# Patient Record
Sex: Female | Born: 1980 | Race: White | Hispanic: No | Marital: Married | Smoking: Never smoker
Health system: Southern US, Community
[De-identification: ages and names within clinical notes are randomized; demographics above are authoritative.]

---

## 2006-01-19 ENCOUNTER — Ambulatory Visit: Payer: Self-pay | Admitting: Unknown Physician Specialty

## 2006-02-04 ENCOUNTER — Ambulatory Visit: Payer: Self-pay | Admitting: General Surgery

## 2006-03-06 ENCOUNTER — Encounter (INDEPENDENT_AMBULATORY_CARE_PROVIDER_SITE_OTHER): Payer: Self-pay | Admitting: *Deleted

## 2006-03-06 ENCOUNTER — Inpatient Hospital Stay (HOSPITAL_COMMUNITY): Admission: RE | Admit: 2006-03-06 | Discharge: 2006-03-11 | Payer: Self-pay | Admitting: Plastic Surgery

## 2006-06-08 ENCOUNTER — Emergency Department: Payer: Self-pay | Admitting: Emergency Medicine

## 2006-12-29 HISTORY — PX: AUGMENTATION MAMMAPLASTY: SUR837

## 2008-01-08 IMAGING — US US THYROID
1 series · 17 of 20 positions shown · non-contrast
Comparison: none

REASON FOR EXAM: GOITER
COMMENTS:

[Series 1: us thyroid · 17 of 20 slices shown]
[im 1/20]
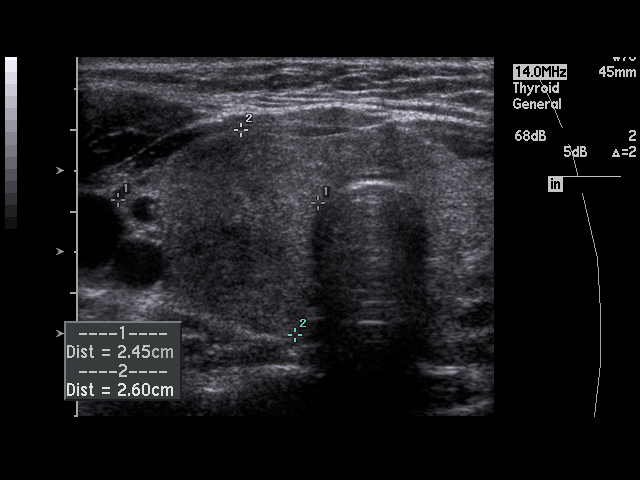
[im 2/20]
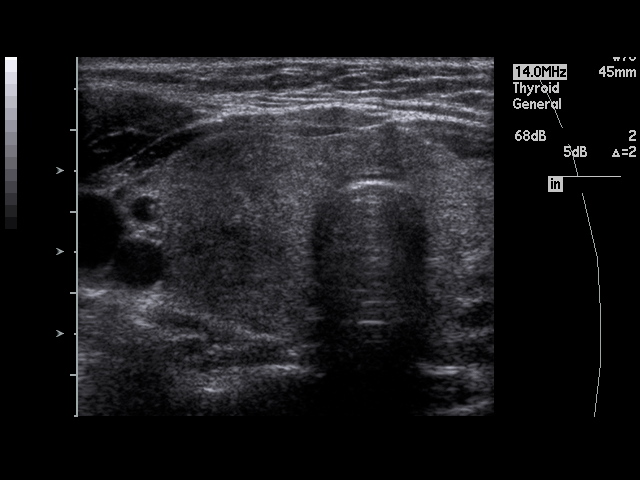
[im 3/20]
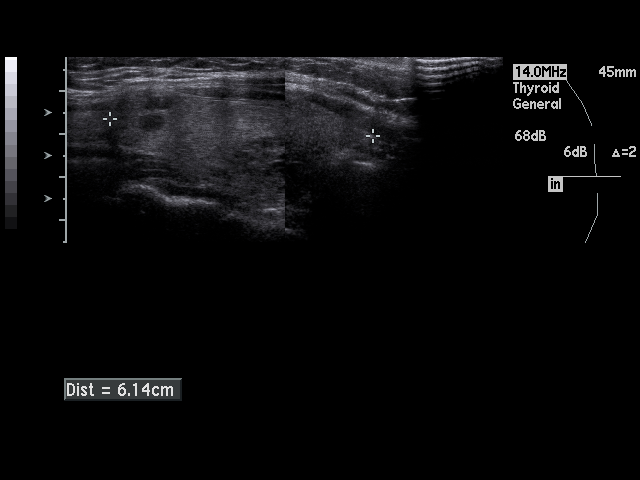
[im 5/20]
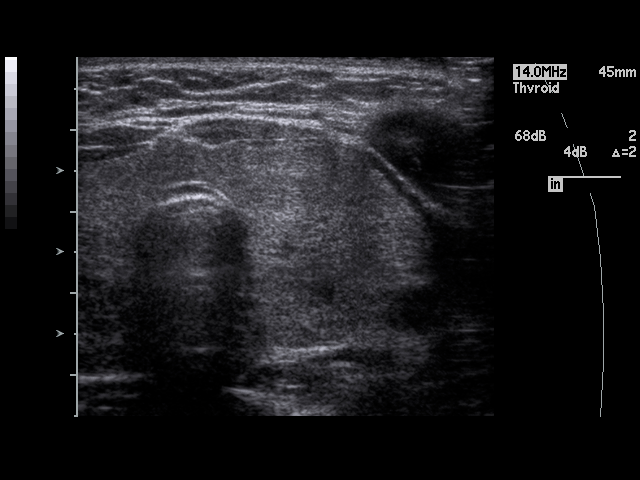
[im 6/20]
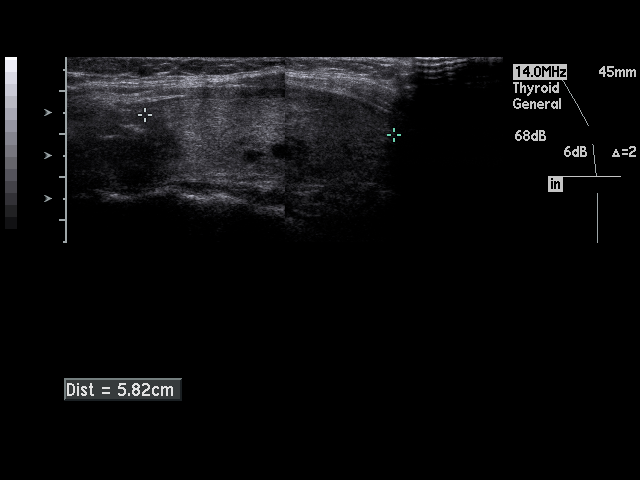
[im 7/20]
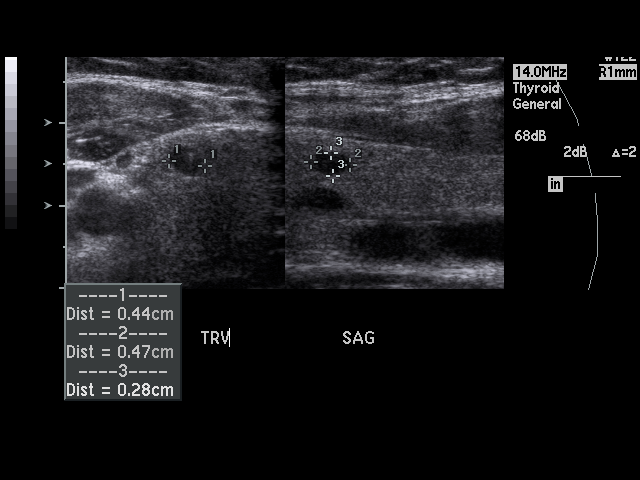
[im 8/20]
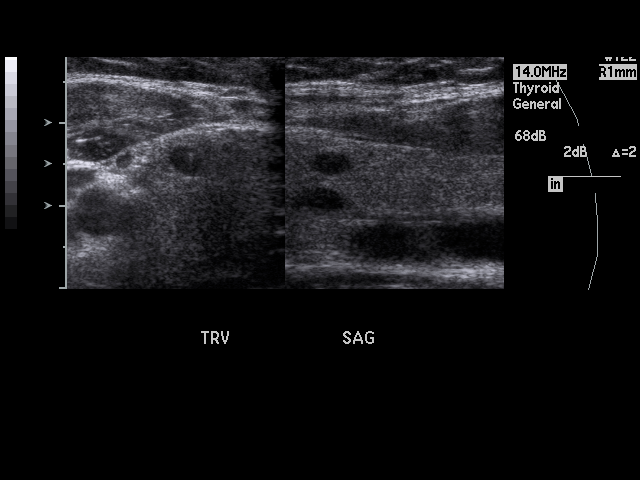
[im 9/20]
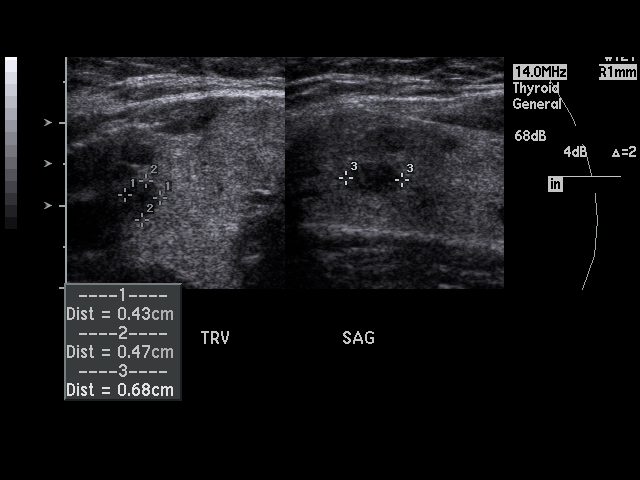
[im 11/20]
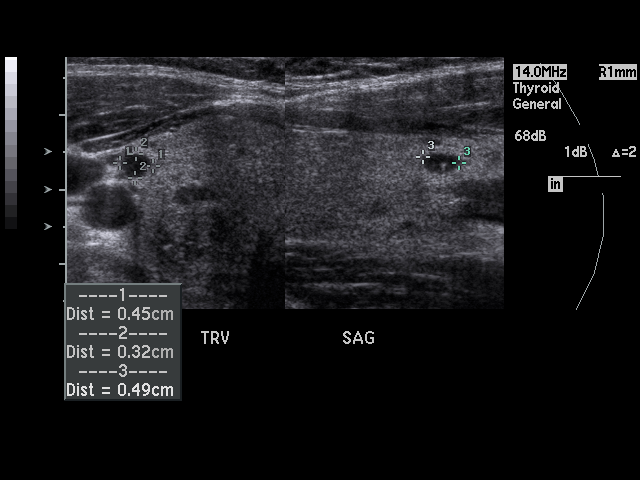
[im 12/20]
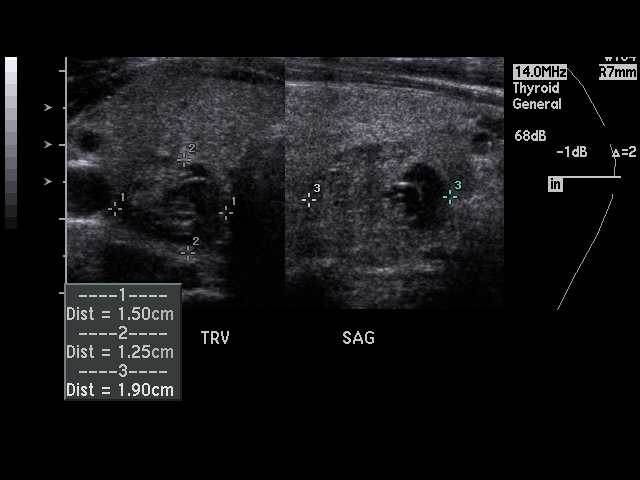
[im 13/20]
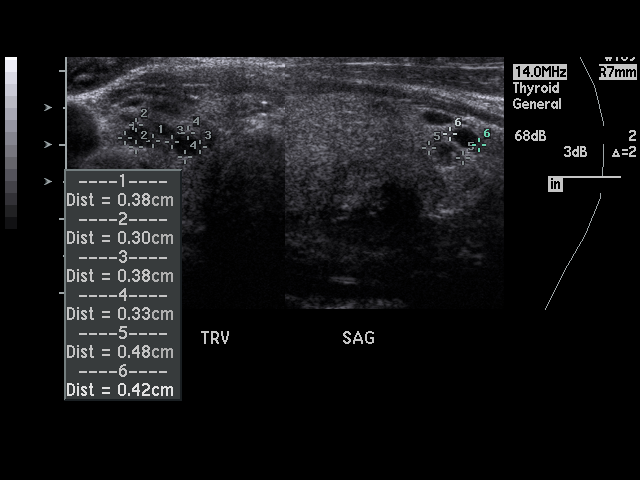
[im 14/20]
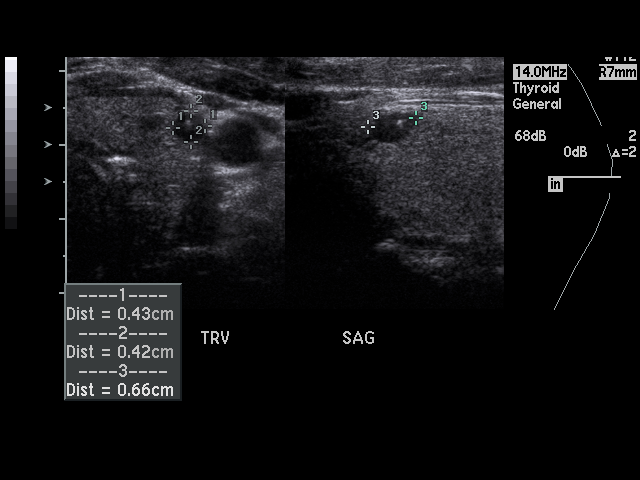
[im 15/20]
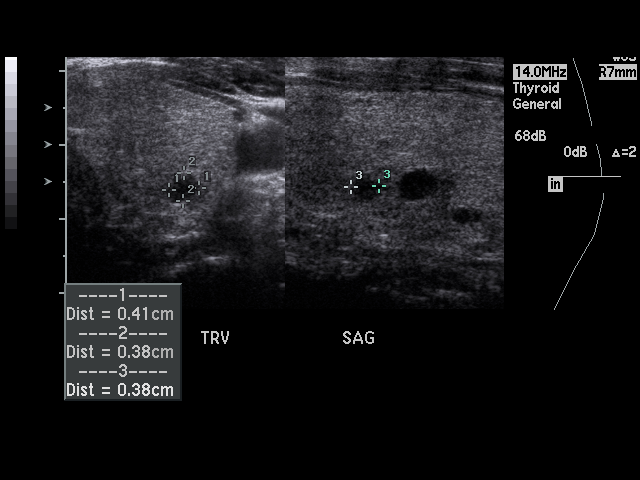
[im 16/20]
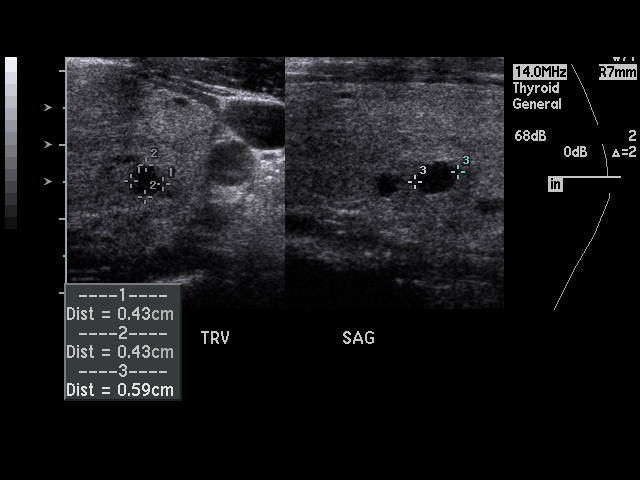
[im 18/20]
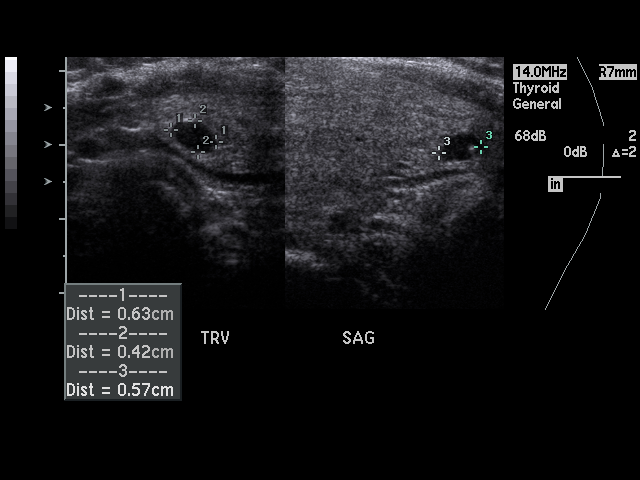
[im 19/20]
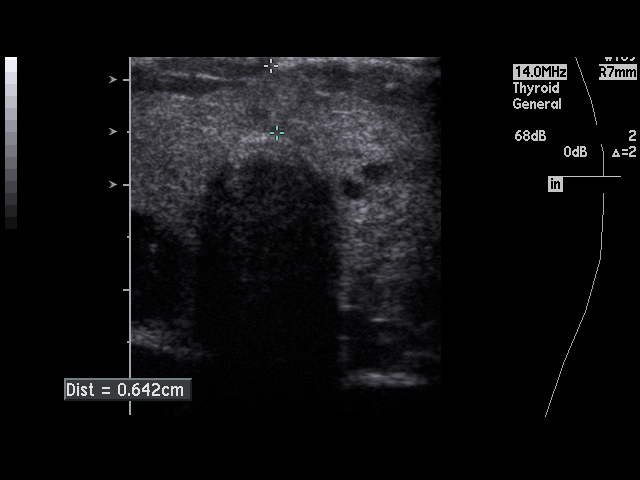
[im 20/20]
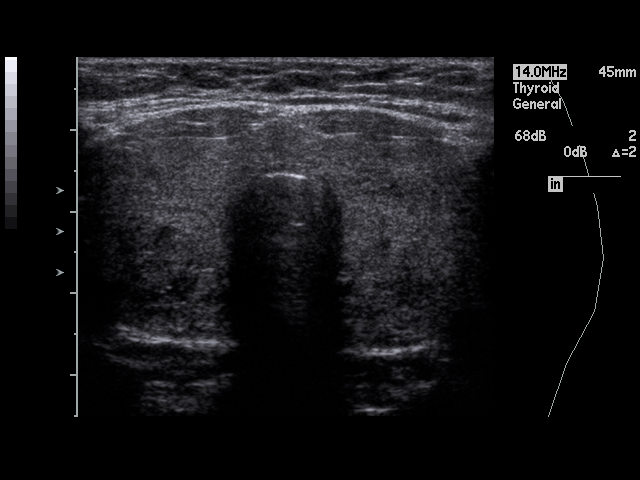

[17 of 20 positions shown; findings below may reference images not displayed]

PROCEDURE:     US  - US THYROID  - January 19, 2006  [DATE]

RESULT:     The RIGHT lobe measures 6.14 cm x 2.60 cm x 2.45 cm. The LEFT
lobe measures 5.82 cm x 2.46 cm x 2.28 cm. There are noted multiple, small,
solid, cystic and complex nodules scattered throughout both lobes. The
largest is a complex nodule in the inferior region of the RIGHT lobe
measuring 1.9 cm at maximum diameter. The additional thirteen to fifteen
nodules measure between 2.0 and 5.0 mm in diameter.
IMPRESSION: 1.     The thyroid lobes appear enlarged.
2.     There are multiple nodules scattered throughout both lobes.
3.     The largest nodule is at the lower pole of the RIGHT lobe and
measures 1.9 cm at maximum diameter.

## 2008-06-08 ENCOUNTER — Encounter
Admission: RE | Admit: 2008-06-08 | Discharge: 2008-06-08 | Payer: Self-pay | Admitting: Physical Medicine & Rehabilitation

## 2008-12-30 ENCOUNTER — Emergency Department: Payer: Self-pay | Admitting: Emergency Medicine

## 2009-02-14 ENCOUNTER — Ambulatory Visit: Payer: Self-pay | Admitting: Orthopedic Surgery

## 2009-04-10 ENCOUNTER — Ambulatory Visit: Payer: Self-pay | Admitting: Obstetrics and Gynecology

## 2009-04-13 ENCOUNTER — Ambulatory Visit: Payer: Self-pay | Admitting: Obstetrics and Gynecology

## 2011-05-16 NOTE — Op Note (Signed)
[name], Katrina Franklin             ACCOUNT NO.:  [id number]   MEDICAL RECORD NO.:  [id number]          PATIENT TYPE:  INP   LOCATION:  2899                         FACILITY:  [facility]   PHYSICIAN:  Etter Sjogren, M.D.     DATE OF BIRTH:  1981/07/14   DATE OF PROCEDURE:  03/06/2006  DATE OF DISCHARGE:                                 OPERATIVE REPORT   PREOPERATIVE DIAGNOSIS:  Multiple keloids of the back.   POSTOPERATIVE DIAGNOSIS:  Multiple keloids of the back.   PROCEDURE:  1.  Excisional preparation of graft site.  2.  Split-thickness skin graft to the back.  3.  Excision of multiple separate keloid scars with complex wound closures,      total length of 22 cm.  4.  Application of a vacuum-assisted closure device.  5.  Placement of Kenalog in the wound bed, a total of 120 mg.   SURGEON:  Etter Sjogren, M.D.   ASSISTANT:  Enedina Finner, R.N.F.A.   INDICATIONS FOR PROCEDURE AND PROCEDURE:  The patient is a 30 year old woman  who has had multiple keloids of her back.  These have been treated multiple  times over at [facility].  Each treatment has made the situation worse.  She now  presents for an attempt at excision and closures with split-thickness skin  grafting to the very largest of the keloids.  The wounds were irrigated  thoroughly with saline.  Meticulous hemostasis with electrocautery.  The  excisions were performed, leaving a rim of keloid around the edge of the  wound in order to try to prevent recurrence.  The closures were with 2-0  nylon interrupted vertical mattress sutures and simple interrupted sutures  as needed.  The split-thickness skin grafts were harvested at the outset of  the case from the thigh and meshed 1.5 to 1.  The graft site was dressed  with Xeroform gauze, Adaptic, 4 x 4s, ABD.  The skin graft was secured  around the periphery using skin staples.  It was dressed with Mepitel and a  vac sponge placed over this under 125 mmHg.  Dry sterile dressings were  applied after placing Kenalog 40 mg per cc.  This was placed directly into  the wound bed.  A total of 120 mg was placed.  Dry sterile dressings  applied.  Tolerated well.  Transported to the recovery room in stable  condition.      Etter Sjogren, M.D.  Electronically Signed     DB/MEDQ  D:  03/06/2006  T:  03/08/2006  Job:  [id number]

## 2011-05-16 NOTE — Discharge Summary (Signed)
Katrina Franklin, Katrina Franklin             ACCOUNT NO.:  [id number]   MEDICAL RECORD NO.:  [id number]          PATIENT TYPE:  INP   LOCATION:  5711                         FACILITY:  [facility]   PHYSICIAN:  Etter Sjogren, M.D.     DATE OF BIRTH:  Jun 22, 1981   DATE OF ADMISSION:  03/06/2006  DATE OF DISCHARGE:  03/11/2006                                 DISCHARGE SUMMARY   FINAL DIAGNOSIS:  Multiple keloids of the back.   PROCEDURE PERFORMED:  Excision, multiple keloids, with complex wound  closures, and one large keloid required skin grafting to the wound bed with  placement of a VAC.   CONDITION ON DISCHARGE:  Very good.   SUMMARY OF HISTORY OF PRESENT ILLNESS:  This is a 30 year old woman who has  had multiple keloids of her back.  These were extremely large and painful,  and medical intervention was necessary.  The procedure and risks were  discussed, including the possibility of recurrence of the keloids.  She  wished to proceed with the surgical intervention and understood there would  be skin grafting for one of the very large ones.   COURSE IN HOSPITAL:  On admission, she was taken to surgery, at which time  the keloids were excised.  Wounds were closed, with the exception of one  very large wound that was skin grafted.  VAC was placed over this.  She  tolerated the procedure well.  Postoperatively, she did very well.  She  remained afebrile.  Wounds looked good.  The back was left with continuous  suction for five days, with removal on the fifth day, and a full-thickness  skin graft was noted.  Donor site on the left side looks very good.  It was  felt the patient was ready to be discharged.   DISPOSITION:  She is discharged on:  1.  Percocet 5-mg tablets (total of 20 given) one p.o. q.4h. p.r.n. pain.  2.  Flagyl 500 mg p.o. b.i.d. for a few days in case she develops a yeast      infection.   Her antibiotics have been discontinued at this point.   DISCHARGE INSTRUCTIONS:  No  lifting.  No exercising.  No shower yet.  We  will see her back, in the office, next week, for recheck.  Visiting nurse  will perform daily dressing changes as ordered.      Etter Sjogren, M.D.  Electronically Signed     DB/MEDQ  D:  03/11/2006  T:  03/11/2006  Job:  [id number]

## 2013-01-07 DIAGNOSIS — E049 Nontoxic goiter, unspecified: Secondary | ICD-10-CM | POA: Insufficient documentation

## 2013-12-12 DIAGNOSIS — L910 Hypertrophic scar: Secondary | ICD-10-CM | POA: Insufficient documentation

## 2013-12-30 DIAGNOSIS — O34219 Maternal care for unspecified type scar from previous cesarean delivery: Secondary | ICD-10-CM | POA: Insufficient documentation

## 2013-12-30 DIAGNOSIS — O09299 Supervision of pregnancy with other poor reproductive or obstetric history, unspecified trimester: Secondary | ICD-10-CM | POA: Insufficient documentation

## 2014-01-04 DIAGNOSIS — O358XX0 Maternal care for other (suspected) fetal abnormality and damage, not applicable or unspecified: Secondary | ICD-10-CM | POA: Insufficient documentation

## 2014-02-15 DIAGNOSIS — O358XX0 Maternal care for other (suspected) fetal abnormality and damage, not applicable or unspecified: Secondary | ICD-10-CM | POA: Insufficient documentation

## 2014-02-15 DIAGNOSIS — O35EXX0 Maternal care for other (suspected) fetal abnormality and damage, fetal genitourinary anomalies, not applicable or unspecified: Secondary | ICD-10-CM | POA: Insufficient documentation

## 2014-03-03 DIAGNOSIS — R002 Palpitations: Secondary | ICD-10-CM | POA: Insufficient documentation

## 2014-05-01 DIAGNOSIS — B349 Viral infection, unspecified: Secondary | ICD-10-CM | POA: Insufficient documentation

## 2014-05-30 DIAGNOSIS — D509 Iron deficiency anemia, unspecified: Secondary | ICD-10-CM | POA: Insufficient documentation

## 2014-06-23 DIAGNOSIS — Z309 Encounter for contraceptive management, unspecified: Secondary | ICD-10-CM | POA: Insufficient documentation

## 2017-12-03 ENCOUNTER — Ambulatory Visit: Payer: BLUE CROSS/BLUE SHIELD | Admitting: Podiatry

## 2017-12-03 ENCOUNTER — Encounter: Payer: Self-pay | Admitting: Podiatry

## 2017-12-03 DIAGNOSIS — B351 Tinea unguium: Secondary | ICD-10-CM | POA: Diagnosis not present

## 2017-12-03 MED ORDER — TERBINAFINE HCL 250 MG PO TABS: 250.0000 mg | ORAL_TABLET | Freq: Every day | ORAL | 0 refills | Status: DC

## 2017-12-03 NOTE — Progress Notes (Signed)
   Subjective:    Patient ID: Katrina Franklin, female    DOB: 12/17/1981, 36 y.o.   MRN: [id number]  HPI    Review of Systems  All other systems reviewed and are negative.      Objective:   Physical Exam        Assessment & Plan:

## 2017-12-03 NOTE — Progress Notes (Signed)
Subjective:   Patient ID: Katrina Franklin, female   DOB: 36 y.o.   MRN: [id number]   HPI Patient presents stating that she has lost her 2 big toenails there are thick and she has been diagnosed with psoriatic arthritis but is not sure if this is her problem.   Review of Systems  All other systems reviewed and are negative.       Objective:  Physical Exam  Constitutional: She appears well-developed and well-nourished.  Cardiovascular: Intact distal pulses.  Pulmonary/Chest: Effort normal.  Musculoskeletal: Normal range of motion.  Neurological: She is alert.  Skin: Skin is warm.  Nursing note and vitals reviewed.    Neurovascular status found to be intact muscle strength adequate range of motion within normal limits.  Patient noted to have thick hallux nails bilaterally to the right hallux being partially off from trauma.  It is localized to this area with no proximal signs of infection and I noted no inflammatory changes in the lesser joints of the toes or MPJs that would indicate psoriatic arthritis.  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      Plan:  Probability for fungal infection with possibility also for inflammatory condition which may be part of the pathological process this patient is experiencing.  At this time we will start her on oral Lamisil she did well with this a number of years ago see the results of the nailbeds and also start topical medicines.  Patient will be seen back to recheck as needed and was given instructions for liver function and gave her requisition form function studies

## 2017-12-10 ENCOUNTER — Telehealth: Payer: Self-pay | Admitting: Podiatry

## 2017-12-10 MED ORDER — NONFORMULARY OR COMPOUNDED ITEM
2 refills | Status: DC
Start: 2017-12-10 — End: 2019-03-03

## 2017-12-10 NOTE — Telephone Encounter (Signed)
I informed pt the topical anti-fungal [facility] [place] [facility] [phone] and they would call with coverage and delivery information. Pt states understanding. [facility].

## 2017-12-10 NOTE — Addendum Note (Signed)
Addended by: Alphia Kava'CONNELL, VALERY D on: 12/10/2017 04:33 PM   Modules accepted: Orders

## 2017-12-10 NOTE — Telephone Encounter (Signed)
I had called and left a voicemail on Friday and I have not received a call back. I was seen there and was diagnosed with toenail fungus. I've started the medication, the oral medication but he said during the visit he was going to prescribe both an oral and a topical medication. I did not have the topical at the pharmacy and I called and checked with them last night. If someone would please call me back at [phone]. Thank you.

## 2017-12-11 ENCOUNTER — Telehealth: Payer: Self-pay | Admitting: Podiatry

## 2017-12-11 NOTE — Telephone Encounter (Signed)
I was seen last week and a prescription was supposed to be called in. I spoke to a nurse yesterday and she said it would have [facility]. [facility] and they do not have the prescription. [place] this afternoon at 3:00 pm and I would [facility]. If someone would please call me back to follow up at [phone]. Thank you.

## 2017-12-11 NOTE — Telephone Encounter (Signed)
I spoke with Elise[facility] and she states rx has been received and will contact pt.

## 2017-12-26 DIAGNOSIS — S39012A Strain of muscle, fascia and tendon of lower back, initial encounter: Secondary | ICD-10-CM | POA: Insufficient documentation

## 2018-01-07 DIAGNOSIS — L4050 Arthropathic psoriasis, unspecified: Secondary | ICD-10-CM | POA: Insufficient documentation

## 2018-01-07 DIAGNOSIS — M1990 Unspecified osteoarthritis, unspecified site: Secondary | ICD-10-CM | POA: Insufficient documentation

## 2018-05-20 DIAGNOSIS — S0300XA Dislocation of jaw, unspecified side, initial encounter: Secondary | ICD-10-CM | POA: Insufficient documentation

## 2018-05-20 DIAGNOSIS — R438 Other disturbances of smell and taste: Secondary | ICD-10-CM | POA: Insufficient documentation

## 2018-08-09 DIAGNOSIS — M542 Cervicalgia: Secondary | ICD-10-CM | POA: Insufficient documentation

## 2018-11-16 DIAGNOSIS — Z6841 Body Mass Index (BMI) 40.0 and over, adult: Secondary | ICD-10-CM

## 2019-03-03 ENCOUNTER — Encounter: Payer: Self-pay | Admitting: Podiatry

## 2019-03-03 ENCOUNTER — Ambulatory Visit (INDEPENDENT_AMBULATORY_CARE_PROVIDER_SITE_OTHER): Payer: BLUE CROSS/BLUE SHIELD | Admitting: Podiatry

## 2019-03-03 DIAGNOSIS — B351 Tinea unguium: Secondary | ICD-10-CM

## 2019-03-03 DIAGNOSIS — E282 Polycystic ovarian syndrome: Secondary | ICD-10-CM | POA: Insufficient documentation

## 2019-03-06 NOTE — Progress Notes (Signed)
Subjective:   Patient ID: Katrina Franklin, female   DOB: 38 y.o.   MRN: [id number]   HPI Patient presents concerned because she is got a small streak in the inside and medial central fourth portion of the left big toenail and wants to make sure nothing is wrong   ROS      Objective:  Physical Exam  Neurovascular status intact with patient having had history of her trauma left big toenail which has remained relatively stable and is in the center portion of the nailbed     Assessment:  Irritation of the nail with most likely trauma and no current indication that there is any other pathological process     Plan:  H&P and educated her on the probable trauma element of this condition and the slight possibility of a very very minimal chance of melanoma or other kind of condition.  I did discuss nail removal with pathology and she denies wanting this and states she wants to let it grow out and I did state if it should turn dark or start to become painful any drainage or other pathology were to occur then she is to reappoint immediately

## 2020-02-03 ENCOUNTER — Telehealth: Payer: Self-pay | Admitting: Lactation Services

## 2020-02-03 NOTE — Telephone Encounter (Signed)
Returning call from 1311 today, wants the dosage for taking Moringa, I read her the recommendations in [facility] office from a printout on herbal galactogogues

## 2020-06-27 ENCOUNTER — Other Ambulatory Visit: Payer: Self-pay | Admitting: Student

## 2020-06-27 DIAGNOSIS — Z1231 Encounter for screening mammogram for malignant neoplasm of breast: Secondary | ICD-10-CM

## 2020-07-12 ENCOUNTER — Ambulatory Visit
Admission: RE | Admit: 2020-07-12 | Discharge: 2020-07-12 | Disposition: A | Discharge status: Home / Self Care | Payer: BC Managed Care – PPO | Source: Ambulatory Visit | Attending: Student | Admitting: Student

## 2020-07-12 DIAGNOSIS — Z1231 Encounter for screening mammogram for malignant neoplasm of breast: Secondary | ICD-10-CM | POA: Insufficient documentation

## 2020-07-17 ENCOUNTER — Other Ambulatory Visit: Payer: Self-pay | Admitting: Student

## 2020-07-17 DIAGNOSIS — Z1231 Encounter for screening mammogram for malignant neoplasm of breast: Secondary | ICD-10-CM

## 2020-07-17 DIAGNOSIS — N6320 Unspecified lump in the left breast, unspecified quadrant: Secondary | ICD-10-CM

## 2020-08-03 ENCOUNTER — Ambulatory Visit
Admission: RE | Admit: 2020-08-03 | Discharge: 2020-08-03 | Disposition: A | Discharge status: Home / Self Care | Payer: BC Managed Care – PPO | Source: Ambulatory Visit | Attending: Student | Admitting: Student

## 2020-08-03 ENCOUNTER — Other Ambulatory Visit: Payer: Self-pay

## 2020-08-03 DIAGNOSIS — Z1231 Encounter for screening mammogram for malignant neoplasm of breast: Secondary | ICD-10-CM

## 2020-08-03 DIAGNOSIS — N6320 Unspecified lump in the left breast, unspecified quadrant: Secondary | ICD-10-CM

## 2021-07-17 ENCOUNTER — Other Ambulatory Visit: Payer: Self-pay | Admitting: Student

## 2021-07-17 DIAGNOSIS — Z1231 Encounter for screening mammogram for malignant neoplasm of breast: Secondary | ICD-10-CM

## 2021-08-05 ENCOUNTER — Ambulatory Visit
Admission: RE | Admit: 2021-08-05 | Discharge: 2021-08-05 | Disposition: A | Discharge status: Home / Self Care | Payer: BC Managed Care – PPO | Source: Ambulatory Visit | Attending: Student | Admitting: Student

## 2021-08-05 ENCOUNTER — Other Ambulatory Visit: Payer: Self-pay | Admitting: Student

## 2021-08-05 ENCOUNTER — Other Ambulatory Visit: Payer: Self-pay

## 2021-08-05 DIAGNOSIS — Z1231 Encounter for screening mammogram for malignant neoplasm of breast: Secondary | ICD-10-CM

## 2022-03-04 ENCOUNTER — Other Ambulatory Visit: Payer: Self-pay | Admitting: Physician Assistant

## 2022-03-04 DIAGNOSIS — R42 Dizziness and giddiness: Secondary | ICD-10-CM

## 2022-03-12 ENCOUNTER — Ambulatory Visit
Admission: RE | Admit: 2022-03-12 | Discharge: 2022-03-12 | Disposition: A | Discharge status: Home / Self Care | Payer: BC Managed Care – PPO | Source: Ambulatory Visit | Attending: Physician Assistant | Admitting: Physician Assistant

## 2022-03-12 ENCOUNTER — Other Ambulatory Visit: Payer: Self-pay

## 2022-03-12 DIAGNOSIS — R42 Dizziness and giddiness: Secondary | ICD-10-CM | POA: Insufficient documentation

## 2022-06-23 ENCOUNTER — Other Ambulatory Visit: Payer: Self-pay | Admitting: Student

## 2022-06-23 DIAGNOSIS — Z1231 Encounter for screening mammogram for malignant neoplasm of breast: Secondary | ICD-10-CM

## 2022-08-08 ENCOUNTER — Ambulatory Visit
Admission: RE | Admit: 2022-08-08 | Discharge: 2022-08-08 | Disposition: A | Discharge status: Home / Self Care | Payer: BC Managed Care – PPO | Source: Ambulatory Visit | Attending: Student | Admitting: Student

## 2022-08-08 DIAGNOSIS — Z1231 Encounter for screening mammogram for malignant neoplasm of breast: Secondary | ICD-10-CM | POA: Diagnosis present

## 2024-03-21 ENCOUNTER — Other Ambulatory Visit: Payer: Self-pay | Admitting: Family Medicine

## 2024-03-21 DIAGNOSIS — Z1231 Encounter for screening mammogram for malignant neoplasm of breast: Secondary | ICD-10-CM
